# Patient Record
Sex: Female | Born: 1970 | Race: White | Hispanic: No | Marital: Married | Smoking: Never smoker
Health system: Southern US, Community
[De-identification: ages and names within clinical notes are randomized; demographics above are authoritative.]

## PROBLEM LIST (undated history)

## (undated) DIAGNOSIS — D649 Anemia, unspecified: Secondary | ICD-10-CM

## (undated) DIAGNOSIS — Z803 Family history of malignant neoplasm of breast: Secondary | ICD-10-CM

## (undated) HISTORY — DX: Anemia, unspecified: D64.9

## (undated) HISTORY — DX: Family history of malignant neoplasm of breast: Z80.3

---

## 2009-11-02 ENCOUNTER — Inpatient Hospital Stay: Payer: Self-pay

## 2009-11-05 LAB — PATHOLOGY REPORT

## 2013-05-06 ENCOUNTER — Emergency Department: Payer: Self-pay | Admitting: Emergency Medicine

## 2013-05-06 LAB — URINALYSIS, COMPLETE
Bilirubin,UR: NEGATIVE
GLUCOSE, UR: NEGATIVE mg/dL (ref 0–75)
Ketone: NEGATIVE
Nitrite: NEGATIVE
PROTEIN: NEGATIVE
Ph: 6 (ref 4.5–8.0)
RBC,UR: 5 /HPF (ref 0–5)
SPECIFIC GRAVITY: 1.008 (ref 1.003–1.030)
WBC UR: 114 /HPF (ref 0–5)

## 2013-11-23 ENCOUNTER — Ambulatory Visit: Payer: Self-pay | Admitting: Nurse Practitioner

## 2015-07-10 ENCOUNTER — Other Ambulatory Visit: Payer: Self-pay | Admitting: Obstetrics & Gynecology

## 2015-12-26 ENCOUNTER — Other Ambulatory Visit: Payer: Self-pay | Admitting: Obstetrics & Gynecology

## 2015-12-26 DIAGNOSIS — Z1231 Encounter for screening mammogram for malignant neoplasm of breast: Secondary | ICD-10-CM

## 2015-12-30 ENCOUNTER — Ambulatory Visit: Admission: RE | Admit: 2015-12-30 | Payer: Self-pay | Source: Ambulatory Visit

## 2016-04-01 DIAGNOSIS — R5383 Other fatigue: Secondary | ICD-10-CM | POA: Diagnosis not present

## 2016-04-01 DIAGNOSIS — R51 Headache: Secondary | ICD-10-CM | POA: Diagnosis not present

## 2016-04-23 DIAGNOSIS — R51 Headache: Secondary | ICD-10-CM | POA: Diagnosis not present

## 2016-04-29 DIAGNOSIS — G4452 New daily persistent headache (NDPH): Secondary | ICD-10-CM | POA: Diagnosis not present

## 2016-04-29 DIAGNOSIS — E559 Vitamin D deficiency, unspecified: Secondary | ICD-10-CM | POA: Diagnosis not present

## 2016-04-29 DIAGNOSIS — E538 Deficiency of other specified B group vitamins: Secondary | ICD-10-CM | POA: Diagnosis not present

## 2016-04-29 DIAGNOSIS — G933 Postviral fatigue syndrome: Secondary | ICD-10-CM | POA: Diagnosis not present

## 2016-07-06 DIAGNOSIS — G4452 New daily persistent headache (NDPH): Secondary | ICD-10-CM | POA: Diagnosis not present

## 2016-07-06 DIAGNOSIS — E559 Vitamin D deficiency, unspecified: Secondary | ICD-10-CM | POA: Diagnosis not present

## 2016-07-31 DIAGNOSIS — R05 Cough: Secondary | ICD-10-CM | POA: Diagnosis not present

## 2016-07-31 DIAGNOSIS — B9689 Other specified bacterial agents as the cause of diseases classified elsewhere: Secondary | ICD-10-CM | POA: Diagnosis not present

## 2016-07-31 DIAGNOSIS — J208 Acute bronchitis due to other specified organisms: Secondary | ICD-10-CM | POA: Diagnosis not present

## 2016-09-01 ENCOUNTER — Telehealth: Payer: Self-pay | Admitting: Obstetrics & Gynecology

## 2016-09-01 NOTE — Telephone Encounter (Signed)
Called and left voicemail for patient to call abck to be schedule for annual with Dr. Tiburcio PeaHarris. 08/04/2015 last annual appointment

## 2016-09-01 NOTE — Telephone Encounter (Signed)
-----   Message from Nadara Mustardobert P Harris, MD sent at 09/01/2016  9:32 AM EDT ----- Regarding: RE: Annual- PH Yes, sch (schedule) annual appt  ----- Message ----- From: Kathi LudwigPaschal, Sara E Sent: 09/01/2016   8:00 AM To: Nadara Mustardobert P Harris, MD Subject: RE: Annual- PH                                 Please advise Schedule appointment? Thank you. Huntley DecSara  ----- Message ----- From: Nadara MustardHarris, Robert P, MD Sent: 09/01/2016   7:50 AM To: Ws-Admin Subject: Annual- PH                                     Sch

## 2016-09-08 NOTE — Telephone Encounter (Signed)
[employer] calling to f/u on rx sent to office for pt's pain med request.  If approved please fax back to [phone] or call [phone]

## 2016-09-22 ENCOUNTER — Ambulatory Visit: Payer: Self-pay | Admitting: Obstetrics & Gynecology

## 2016-09-30 ENCOUNTER — Other Ambulatory Visit: Payer: Self-pay | Admitting: Obstetrics & Gynecology

## 2016-10-05 ENCOUNTER — Other Ambulatory Visit: Payer: Self-pay

## 2016-10-05 MED ORDER — LO LOESTRIN FE 1 MG-10 MCG / 10 MCG PO TABS: 1.0000 | ORAL_TABLET | Freq: Every day | ORAL | 0 refills | Status: DC

## 2016-11-05 ENCOUNTER — Ambulatory Visit: Payer: Self-pay | Admitting: Obstetrics & Gynecology

## 2016-12-02 ENCOUNTER — Encounter: Payer: Self-pay | Admitting: Obstetrics & Gynecology

## 2016-12-02 ENCOUNTER — Ambulatory Visit (INDEPENDENT_AMBULATORY_CARE_PROVIDER_SITE_OTHER): Payer: BLUE CROSS/BLUE SHIELD | Admitting: Obstetrics & Gynecology

## 2016-12-02 VITALS — BP 110/60 | HR 64 | Ht 67.0 in | Wt 125.0 lb

## 2016-12-02 DIAGNOSIS — Z01419 Encounter for gynecological examination (general) (routine) without abnormal findings: Secondary | ICD-10-CM | POA: Diagnosis not present

## 2016-12-02 DIAGNOSIS — Z0000 Encounter for general adult medical examination without abnormal findings: Secondary | ICD-10-CM

## 2016-12-02 DIAGNOSIS — Z1239 Encounter for other screening for malignant neoplasm of breast: Secondary | ICD-10-CM

## 2016-12-02 DIAGNOSIS — Z124 Encounter for screening for malignant neoplasm of cervix: Secondary | ICD-10-CM

## 2016-12-02 DIAGNOSIS — Z1231 Encounter for screening mammogram for malignant neoplasm of breast: Secondary | ICD-10-CM | POA: Diagnosis not present

## 2016-12-02 MED ORDER — LO LOESTRIN FE 1 MG-10 MCG / 10 MCG PO TABS: 1.0000 | ORAL_TABLET | Freq: Every day | ORAL | 3 refills | Status: DC

## 2016-12-02 NOTE — Progress Notes (Signed)
HPI:      [name] is a 46 y.o. [phone] who LMP was Patient's last menstrual period was 11/11/2016., she presents today for her annual examination. The patient has no complaints today. The patient is sexually active. Her last pap: was normal and last mammogram: approximate date 2017 and was normal. The patient does perform self breast exams.  There is no notable family history of breast or ovarian cancer in her family.  The patient has regular exercise: yes.  The patient denies current symptoms of depression.    GYN History: Contraception: OCP (estrogen/progesterone)  PMHx: Past Medical History:  Diagnosis Date  . Anemia    Past Surgical History:  Procedure Laterality Date  . CESAREAN SECTION     Family History  Problem Relation Age of Onset  . Cancer Father   . Breast cancer Maternal Grandmother   . Stomach cancer Maternal Grandmother   . Leukemia Maternal Grandfather   . Heart attack Paternal Grandmother    Social History  Substance Use Topics  . Smoking status: Never Smoker  . Smokeless tobacco: Never Used  . Alcohol use No    Current Outpatient Prescriptions:  .  LO LOESTRIN FE 1 MG-10 MCG / 10 MCG tablet, Take 1 tablet by mouth daily., Disp: 3 Package, Rfl: 0 .  vitamin B-12 (CYANOCOBALAMIN) 1000 MCG tablet, Take by mouth., Disp: , Rfl:  Allergies: Lactase  Review of Systems  Constitutional: Negative for chills, fever and malaise/fatigue.  HENT: Negative for congestion, sinus pain and sore throat.   Eyes: Negative for blurred vision and pain.  Respiratory: Negative for cough and wheezing.   Cardiovascular: Negative for chest pain and leg swelling.  Gastrointestinal: Negative for abdominal pain, constipation, diarrhea, heartburn, nausea and vomiting.  Genitourinary: Negative for dysuria, frequency, hematuria and urgency.  Musculoskeletal: Negative for back pain, joint pain, myalgias and neck pain.  Skin: Negative for itching and rash.  Neurological:  Negative for dizziness, tremors and weakness.  Endo/Heme/Allergies: Does not bruise/bleed easily.  Psychiatric/Behavioral: Negative for depression. The patient is not nervous/anxious and does not have insomnia.     Objective: BP 110/60   Pulse 64   Ht 5\' 7"  (1.702 m)   Wt 125 lb (56.7 kg)   LMP 11/11/2016   BMI 19.58 kg/m   Filed Weights   12/02/16 1515  Weight: 125 lb (56.7 kg)   Body mass index is 19.58 kg/m. Physical Exam  Constitutional: She is oriented to person, place, and time. She appears well-developed and well-nourished. No distress.  Genitourinary: Rectum normal, vagina normal and uterus normal. Pelvic exam was performed with patient supine. There is no rash or lesion on the right labia. There is no rash or lesion on the left labia. Vagina exhibits no lesion. No bleeding in the vagina. Right adnexum does not display mass and does not display tenderness. Left adnexum does not display mass and does not display tenderness. Cervix does not exhibit motion tenderness, lesion, friability or polyp.   Uterus is mobile and midaxial. Uterus is not enlarged or exhibiting a mass.  HENT:  Head: Normocephalic and atraumatic. Head is without laceration.  Right Ear: Hearing normal.  Left Ear: Hearing normal.  Nose: No epistaxis.  No foreign bodies.  Mouth/Throat: Uvula is midline, oropharynx is clear and moist and mucous membranes are normal.  Eyes: Pupils are equal, round, and reactive to light.  Neck: Normal range of motion. Neck supple. No thyromegaly present.  Cardiovascular: Normal rate and regular rhythm.  Exam reveals no gallop and no friction rub.   No murmur heard. Pulmonary/Chest: Effort normal and breath sounds normal. No respiratory distress. She has no wheezes. Right breast exhibits no mass, no skin change and no tenderness. Left breast exhibits no mass, no skin change and no tenderness.  Abdominal: Soft. Bowel sounds are normal. She exhibits no distension. There is no  tenderness. There is no rebound.  Musculoskeletal: Normal range of motion.  Neurological: She is alert and oriented to person, place, and time. No cranial nerve deficit.  Skin: Skin is warm and dry.  Psychiatric: She has a normal mood and affect. Judgment normal.  Vitals reviewed.  Assessment:  ANNUAL EXAM 1. Annual physical exam   2. Screening for cervical cancer   3. Screening for breast cancer    Screening Plan:            1.  Cervical Screening-  Pap smear done today  2. Breast screening- Exam annually and mammogram>40 planned   3. Colonoscopy every 10 years, Hemoccult testing - after age 47  4. Labs normal last year, repeat 2019  5. Counseling for contraception: oral contraceptives (estrogen/progesterone), at least until age 47; no change desired  6. PCP in area discussed     F/U  Return in about 1 year (around 12/02/2017) for Annual.  Annamarie MajorPaul Mishell Donalson, MD, Merlinda FrederickFACOG [facility], [facility] 12/02/2016  3:20 PM

## 2016-12-02 NOTE — Patient Instructions (Addendum)
PAP every three years Mammogram every year    Call [phone] [facility] Colonoscopy every 10 years after age 47 Labs next year   PRIMARY CARE Dr Marikay AlarEric [facility] physicians [facility]

## 2016-12-06 LAB — IGP, APTIMA HPV
HPV Aptima: NEGATIVE
PAP SMEAR COMMENT: 0

## 2016-12-26 ENCOUNTER — Other Ambulatory Visit: Payer: Self-pay | Admitting: Obstetrics & Gynecology

## 2017-03-04 DIAGNOSIS — H1132 Conjunctival hemorrhage, left eye: Secondary | ICD-10-CM | POA: Diagnosis not present

## 2017-04-11 ENCOUNTER — Telehealth: Payer: Self-pay

## 2017-04-11 ENCOUNTER — Other Ambulatory Visit: Payer: Self-pay | Admitting: Obstetrics & Gynecology

## 2017-04-11 MED ORDER — NORETHIN ACE-ETH ESTRAD-FE 1-20 MG-MCG PO TABS: 1.0000 | ORAL_TABLET | Freq: Every day | ORAL | 11 refills | Status: DC

## 2017-04-11 NOTE — Telephone Encounter (Signed)
lmtrc

## 2017-04-11 NOTE — Telephone Encounter (Signed)
Plan does not cover LoLoestrin can you prescribe something different.

## 2017-04-11 NOTE — Telephone Encounter (Signed)
ERx done LoLoEstrin/Junel generic

## 2017-09-01 ENCOUNTER — Telehealth: Payer: Self-pay

## 2017-09-01 NOTE — Telephone Encounter (Signed)
Pt needs [place] rx ref[place] to [place]. Cb#[phone]

## 2017-09-01 NOTE — Telephone Encounter (Signed)
Spoke w/pt. She is out of town & needs her OCP refill. [facility] [place]. Unable to pull pharmacy in Epic. Pt stated pharmacist there told her they didn't accept rx's from [facility]. It would need to come from [facility] [place] [facility]. Pt advised to contact her local [facility] where rx is currently & see if they can advise her if a possible rx transfer is possible between the pharmacies. She will return call if she needs further assistance.

## 2018-01-23 ENCOUNTER — Encounter: Payer: Self-pay | Admitting: Obstetrics & Gynecology

## 2018-01-23 ENCOUNTER — Ambulatory Visit (INDEPENDENT_AMBULATORY_CARE_PROVIDER_SITE_OTHER): Payer: BLUE CROSS/BLUE SHIELD | Admitting: Obstetrics & Gynecology

## 2018-01-23 VITALS — BP 120/80 | Ht 67.0 in | Wt 128.0 lb

## 2018-01-23 DIAGNOSIS — Z01419 Encounter for gynecological examination (general) (routine) without abnormal findings: Secondary | ICD-10-CM

## 2018-01-23 DIAGNOSIS — Z1239 Encounter for other screening for malignant neoplasm of breast: Secondary | ICD-10-CM

## 2018-01-23 DIAGNOSIS — Z0000 Encounter for general adult medical examination without abnormal findings: Secondary | ICD-10-CM

## 2018-01-23 MED ORDER — NORETHIN ACE-ETH ESTRAD-FE 1-20 MG-MCG PO TABS: 1.0000 | ORAL_TABLET | Freq: Every day | ORAL | 3 refills | Status: DC

## 2018-01-23 NOTE — Progress Notes (Signed)
HPI:      Rachel Tapia is a 47 y.o. [phone] who LMP was Patient's last menstrual period was 01/16/2018., she presents today for her annual examination. The patient has no complaints today. The patient is sexually active. Her last pap: approximate date 2018 and was normal and last mammogram: approximate date 2016 and was normal. The patient does perform self breast exams.  There is no notable family history of breast or ovarian cancer in her family.  The patient has regular exercise: yes.  The patient denies current symptoms of depression.  Reg periods.  Recent stress from probability of a big move and this has affected libido as well as periods somewhat  GYN History: Contraception: OCP (estrogen/progesterone)  PMHx: Past Medical History:  Diagnosis Date  . Anemia    Past Surgical History:  Procedure Laterality Date  . CESAREAN SECTION     Family History  Problem Relation Age of Onset  . Cancer Father   . Breast cancer Maternal Grandmother   . Stomach cancer Maternal Grandmother   . Leukemia Maternal Grandfather   . Heart attack Paternal Grandmother    Social History   Tobacco Use  . Smoking status: Never Smoker  . Smokeless tobacco: Never Used  Substance Use Topics  . Alcohol use: No  . Drug use: No    Current Outpatient Medications:  .  norethindrone-ethinyl estradiol (JUNEL FE,GILDESS FE,LOESTRIN FE) 1-20 MG-MCG tablet, Take 1 tablet by mouth daily., Disp: 1 Package, Rfl: 11 .  vitamin B-12 (CYANOCOBALAMIN) 1000 MCG tablet, Take by mouth., Disp: , Rfl:  Allergies: Lactase  Review of Systems  Constitutional: Negative for chills, fever and malaise/fatigue.  HENT: Negative for congestion, sinus pain and sore throat.   Eyes: Negative for blurred vision and pain.  Respiratory: Negative for cough and wheezing.   Cardiovascular: Negative for chest pain and leg swelling.  Gastrointestinal: Negative for abdominal pain, constipation, diarrhea, heartburn, nausea and  vomiting.  Genitourinary: Negative for dysuria, frequency, hematuria and urgency.  Musculoskeletal: Negative for back pain, joint pain, myalgias and neck pain.  Skin: Negative for itching and rash.  Neurological: Negative for dizziness, tremors and weakness.  Endo/Heme/Allergies: Does not bruise/bleed easily.  Psychiatric/Behavioral: Negative for depression. The patient is not nervous/anxious and does not have insomnia.     Objective: BP 120/80   Ht 5\' 7"  (1.702 m)   Wt 128 lb (58.1 kg)   LMP 01/16/2018   BMI 20.05 kg/m   Filed Weights   01/23/18 1608  Weight: 128 lb (58.1 kg)   Body mass index is 20.05 kg/m. Physical Exam Constitutional:      General: She is not in acute distress.    Appearance: She is well-developed.  Genitourinary:     Pelvic exam was performed with patient supine.     Vagina, uterus and rectum normal.     No lesions in the vagina.     No vaginal bleeding.     No cervical motion tenderness, friability, lesion or polyp.     Uterus is mobile.     Uterus is not enlarged.     No uterine mass detected.    Uterus is midaxial.     No right or left adnexal mass present.     Right adnexa not tender.     Left adnexa not tender.  HENT:     Head: Normocephalic and atraumatic. No laceration.     Right Ear: Hearing normal.     Left Ear: Hearing normal.  Mouth/Throat:     Pharynx: Uvula midline.  Eyes:     Pupils: Pupils are equal, round, and reactive to light.  Neck:     Musculoskeletal: Normal range of motion and neck supple.     Thyroid: No thyromegaly.  Cardiovascular:     Rate and Rhythm: Normal rate and regular rhythm.     Heart sounds: No murmur. No friction rub. No gallop.   Pulmonary:     Effort: Pulmonary effort is normal. No respiratory distress.     Breath sounds: Normal breath sounds. No wheezing.  Chest:     Breasts:        Right: No mass, skin change or tenderness.        Left: No mass, skin change or tenderness.  Abdominal:      General: Bowel sounds are normal. There is no distension.     Palpations: Abdomen is soft.     Tenderness: There is no abdominal tenderness. There is no rebound.  Musculoskeletal: Normal range of motion.  Neurological:     Mental Status: She is alert and oriented to person, place, and time.     Cranial Nerves: No cranial nerve deficit.  Skin:    General: Skin is warm and dry.  Psychiatric:        Judgment: Judgment normal.  Vitals signs reviewed.     Assessment:  ANNUAL EXAM 1. Annual physical exam   2. Screening for breast cancer    Screening Plan:            1.  Cervical Screening-  Pap smear schedule reviewed with patient  2. Breast screening- Exam annually and mammogram>40 planned and ENCOURAGED to have done this year  3. Colonoscopy every 10 years, Hemoccult testing - after age 28  4. Labs managed by PCP  5. Counseling for contraception: oral contraceptives (estrogen/progesterone), wishes to continue     F/U  Return in about 1 year (around 01/24/2019) for Annual.  Annamarie Major, MD, Merlinda Frederick Ob/Gyn, [facility] 01/23/2018  4:41 PM

## 2018-01-23 NOTE — Patient Instructions (Signed)
PAP every three years Mammogram every year    Call [phone] to schedule at [facility]  

## 2018-01-25 ENCOUNTER — Ambulatory Visit: Payer: Self-pay | Admitting: Obstetrics & Gynecology

## 2018-02-07 ENCOUNTER — Ambulatory Visit
Admission: RE | Admit: 2018-02-07 | Discharge: 2018-02-07 | Disposition: A | Discharge status: Home / Self Care | Payer: BLUE CROSS/BLUE SHIELD | Source: Ambulatory Visit | Attending: Obstetrics & Gynecology | Admitting: Obstetrics & Gynecology

## 2018-02-07 DIAGNOSIS — Z1239 Encounter for other screening for malignant neoplasm of breast: Secondary | ICD-10-CM | POA: Diagnosis not present

## 2018-02-07 DIAGNOSIS — Z1231 Encounter for screening mammogram for malignant neoplasm of breast: Secondary | ICD-10-CM | POA: Diagnosis not present

## 2018-02-09 ENCOUNTER — Telehealth: Payer: Self-pay

## 2018-02-09 NOTE — Telephone Encounter (Signed)
Left message to remind her to schedule her mammo

## 2018-02-09 NOTE — Telephone Encounter (Signed)
-----   Message from Nadara Mustard, MD sent at 02/07/2018  9:00 AM EST ----- Regarding: MMG Received notice she has not received MMG yet as ordered at her Annual. Please check and encourage her to do this, and document conversation.

## 2018-03-18 ENCOUNTER — Other Ambulatory Visit: Payer: Self-pay | Admitting: Obstetrics & Gynecology

## 2018-08-15 ENCOUNTER — Telehealth: Payer: Self-pay

## 2018-08-15 ENCOUNTER — Other Ambulatory Visit: Payer: Self-pay | Admitting: Obstetrics & Gynecology

## 2018-08-15 DIAGNOSIS — R5382 Chronic fatigue, unspecified: Secondary | ICD-10-CM

## 2018-08-15 NOTE — Telephone Encounter (Signed)
Pt calling; is concerned about her energy level; wondering if iron is low or if having blood sugar issues; knows she is hypoglycemic; wonders if something has changed; feels like she needs to have her blood checked for fatigue issues.  [phone]

## 2018-08-15 NOTE — Telephone Encounter (Signed)
Patient is schedule for 08/16/18

## 2018-08-15 NOTE — Telephone Encounter (Signed)
Orders placed for labs, schedule time to come and have blood drawn

## 2018-08-16 ENCOUNTER — Other Ambulatory Visit: Payer: 59

## 2018-08-16 ENCOUNTER — Other Ambulatory Visit: Payer: Self-pay

## 2018-08-16 DIAGNOSIS — R5382 Chronic fatigue, unspecified: Secondary | ICD-10-CM

## 2018-08-17 LAB — CBC WITH DIFFERENTIAL
Basophils Absolute: 0 10*3/uL (ref 0.0–0.2)
Basos: 1 %
EOS (ABSOLUTE): 0.1 10*3/uL (ref 0.0–0.4)
Eos: 1 %
Hematocrit: 38.6 % (ref 34.0–46.6)
Hemoglobin: 13 g/dL (ref 11.1–15.9)
Immature Grans (Abs): 0 10*3/uL (ref 0.0–0.1)
Immature Granulocytes: 0 %
Lymphocytes Absolute: 1.9 10*3/uL (ref 0.7–3.1)
Lymphs: 30 %
MCH: 29.3 pg (ref 26.6–33.0)
MCHC: 33.7 g/dL (ref 31.5–35.7)
MCV: 87 fL (ref 79–97)
Monocytes Absolute: 0.3 10*3/uL (ref 0.1–0.9)
Monocytes: 5 %
Neutrophils Absolute: 4.1 10*3/uL (ref 1.4–7.0)
Neutrophils: 63 %
RBC: 4.43 x10E6/uL (ref 3.77–5.28)
RDW: 12.7 % (ref 11.7–15.4)
WBC: 6.4 10*3/uL (ref 3.4–10.8)

## 2018-08-17 LAB — GLUCOSE, FASTING: Glucose, Plasma: 107 mg/dL — ABNORMAL HIGH (ref 65–99)

## 2018-08-17 LAB — TSH: TSH: 2.73 u[IU]/mL (ref 0.450–4.500)

## 2018-08-17 LAB — VITAMIN B12: Vitamin B-12: 345 pg/mL (ref 232–1245)

## 2018-08-17 NOTE — Progress Notes (Signed)
Pt wants to know was she supposed to fasting? She had ate a bagel before she had the blood work done, Pt is very upset about this level,

## 2018-08-17 NOTE — Progress Notes (Signed)
Let her know, all labs normal with borderline blood sugar level.  The diabetes testing reveals borderline elevation in blood sugar, or pre-diabetes.  This should be addressed by lifestyle modifications that include a diet lower in carbohydrates/ sugars, weight control/ loss, and regular fast-paced exercise.  Recommend repeat testing next year.

## 2018-08-17 NOTE — Progress Notes (Signed)
Yes, that is supposed to be a fasting test.  So she is probably just fine.  Can repeat if desired when fasting

## 2018-08-28 ENCOUNTER — Other Ambulatory Visit: Payer: Self-pay

## 2018-08-28 MED ORDER — NORETHIN ACE-ETH ESTRAD-FE 1-20 MG-MCG PO TABS: 1.0000 | ORAL_TABLET | Freq: Every day | ORAL | 3 refills | Status: DC

## 2019-01-15 ENCOUNTER — Other Ambulatory Visit: Payer: Self-pay | Admitting: Obstetrics & Gynecology

## 2019-01-15 DIAGNOSIS — Z1231 Encounter for screening mammogram for malignant neoplasm of breast: Secondary | ICD-10-CM

## 2019-01-23 ENCOUNTER — Telehealth: Payer: Self-pay

## 2019-01-23 ENCOUNTER — Ambulatory Visit
Admission: RE | Admit: 2019-01-23 | Discharge: 2019-01-23 | Disposition: A | Discharge status: Home / Self Care | Payer: 59 | Source: Ambulatory Visit | Attending: Obstetrics & Gynecology | Admitting: Obstetrics & Gynecology

## 2019-01-23 ENCOUNTER — Encounter: Payer: Self-pay | Admitting: Obstetrics & Gynecology

## 2019-01-23 DIAGNOSIS — Z1231 Encounter for screening mammogram for malignant neoplasm of breast: Secondary | ICD-10-CM | POA: Insufficient documentation

## 2019-01-23 NOTE — Telephone Encounter (Signed)
Pt calling; is moving; needs rx refill (call ends)  [phone]  [facility]

## 2019-01-30 ENCOUNTER — Ambulatory Visit: Payer: 59 | Admitting: Obstetrics & Gynecology

## 2019-01-30 MED ORDER — NORETHIN ACE-ETH ESTRAD-FE 1-20 MG-MCG PO TABS: 1.0000 | ORAL_TABLET | Freq: Every day | ORAL | 0 refills | Status: DC

## 2019-06-19 ENCOUNTER — Ambulatory Visit (INDEPENDENT_AMBULATORY_CARE_PROVIDER_SITE_OTHER): Payer: 59 | Admitting: Obstetrics & Gynecology

## 2019-06-19 ENCOUNTER — Other Ambulatory Visit: Payer: Self-pay

## 2019-06-19 ENCOUNTER — Other Ambulatory Visit (HOSPITAL_COMMUNITY)
Admission: RE | Admit: 2019-06-19 | Discharge: 2019-06-19 | Disposition: A | Discharge status: Home / Self Care | Payer: 59 | Source: Ambulatory Visit | Attending: Obstetrics & Gynecology | Admitting: Obstetrics & Gynecology

## 2019-06-19 ENCOUNTER — Encounter: Payer: Self-pay | Admitting: Obstetrics & Gynecology

## 2019-06-19 VITALS — BP 120/80 | Ht 67.0 in | Wt 127.0 lb

## 2019-06-19 DIAGNOSIS — Z124 Encounter for screening for malignant neoplasm of cervix: Secondary | ICD-10-CM | POA: Insufficient documentation

## 2019-06-19 DIAGNOSIS — Z01419 Encounter for gynecological examination (general) (routine) without abnormal findings: Secondary | ICD-10-CM | POA: Diagnosis not present

## 2019-06-19 DIAGNOSIS — Z1231 Encounter for screening mammogram for malignant neoplasm of breast: Secondary | ICD-10-CM

## 2019-06-19 MED ORDER — NORETHINDRONE ACET-ETHINYL EST 1-20 MG-MCG PO TABS: 1.0000 | ORAL_TABLET | Freq: Every day | ORAL | 3 refills | Status: DC

## 2019-06-19 NOTE — Progress Notes (Signed)
HPI:      [name] is a 49 y.o. [phone] who LMP was Patient's last menstrual period was 06/04/2019., she presents today for her annual examination. The patient has no complaints today. The patient is sexually active. Her last pap: approximate date 2018 and was normal and last mammogram: approximate date 2020 and was normal. The patient does perform self breast exams.  There is no notable family history of breast or ovarian cancer in her family.  The patient has regular exercise: yes.  The patient denies current symptoms of depression.    GYN History: Contraception: OCP (estrogen/progesterone)  PMHx: Past Medical History:  Diagnosis Date  . Anemia    Past Surgical History:  Procedure Laterality Date  . CESAREAN SECTION     Family History  Problem Relation Age of Onset  . Cancer Father   . Breast cancer Maternal Grandmother 60  . Stomach cancer Maternal Grandmother   . Leukemia Maternal Grandfather   . Heart attack Paternal Grandmother    Social History   Tobacco Use  . Smoking status: Never Smoker  . Smokeless tobacco: Never Used  Substance Use Topics  . Alcohol use: No  . Drug use: No    Current Outpatient Medications:  .  vitamin B-12 (CYANOCOBALAMIN) 1000 MCG tablet, Take by mouth., Disp: , Rfl:  .  norethindrone-ethinyl estradiol (JUNEL 1/20) 1-20 MG-MCG tablet, Take 1 tablet by mouth daily., Disp: 3 Package, Rfl: 3 Allergies: Lactase  Review of Systems  Constitutional: Negative for chills, fever and malaise/fatigue.  HENT: Negative for congestion, sinus pain and sore throat.   Eyes: Negative for blurred vision and pain.  Respiratory: Negative for cough and wheezing.   Cardiovascular: Negative for chest pain and leg swelling.  Gastrointestinal: Negative for abdominal pain, constipation, diarrhea, heartburn, nausea and vomiting.  Genitourinary: Negative for dysuria, frequency, hematuria and urgency.  Musculoskeletal: Negative for back pain, joint pain,  myalgias and neck pain.  Skin: Negative for itching and rash.  Neurological: Negative for dizziness, tremors and weakness.  Endo/Heme/Allergies: Does not bruise/bleed easily.  Psychiatric/Behavioral: Negative for depression. The patient is not nervous/anxious and does not have insomnia.     Objective: BP 120/80   Ht 5\' 7"  (1.702 m)   Wt 127 lb (57.6 kg)   LMP 06/04/2019   BMI 19.89 kg/m   Filed Weights   06/19/19 1341  Weight: 127 lb (57.6 kg)   Body mass index is 19.89 kg/m. Physical Exam Constitutional:      General: She is not in acute distress.    Appearance: She is well-developed.  Genitourinary:     Pelvic exam was performed with patient supine.     Vagina, uterus and rectum normal.     No lesions in the vagina.     No vaginal bleeding.     No cervical motion tenderness, friability, lesion or polyp.     Uterus is mobile.     Uterus is not enlarged.     No uterine mass detected.    Uterus is midaxial.     No right or left adnexal mass present.     Right adnexa not tender.     Left adnexa not tender.  HENT:     Head: Normocephalic and atraumatic. No laceration.     Right Ear: Hearing normal.     Left Ear: Hearing normal.     Mouth/Throat:     Pharynx: Uvula midline.  Eyes:     Pupils: Pupils are equal,  round, and reactive to light.  Neck:     Thyroid: No thyromegaly.  Cardiovascular:     Rate and Rhythm: Normal rate and regular rhythm.     Heart sounds: No murmur. No friction rub. No gallop.   Pulmonary:     Effort: Pulmonary effort is normal. No respiratory distress.     Breath sounds: Normal breath sounds. No wheezing.  Chest:     Breasts:        Right: No mass, skin change or tenderness.        Left: No mass, skin change or tenderness.  Abdominal:     General: Bowel sounds are normal. There is no distension.     Palpations: Abdomen is soft.     Tenderness: There is no abdominal tenderness. There is no rebound.  Musculoskeletal:        General:  Normal range of motion.     Cervical back: Normal range of motion and neck supple.  Neurological:     Mental Status: She is alert and oriented to person, place, and time.     Cranial Nerves: No cranial nerve deficit.  Skin:    General: Skin is warm and dry.  Psychiatric:        Judgment: Judgment normal.  Vitals reviewed.     Assessment:  ANNUAL EXAM 1. Women's annual routine gynecological examination   2. Encounter for screening mammogram for malignant neoplasm of breast   3. Screening for cervical cancer      Screening Plan:            1.  Cervical Screening-  Pap smear done today  2. Breast screening- Exam annually and mammogram>40 planned   3. Colonoscopy every 10 years, Hemoccult testing - after age 41  4. Labs managed by PCP  5. Counseling for contraception: oral contraceptives (estrogen/progesterone)    F/U  Return in about 1 year (around 06/18/2020) for Annual.  Annamarie Major, MD, Merlinda Frederick Ob/Gyn, [facility] 06/19/2019  2:36 PM

## 2019-06-19 NOTE — Patient Instructions (Signed)
PAP every three years (done today) Mammogram every year Colonoscopy every 10 years starting age 50 Labs yearly (with PCP)

## 2019-06-19 NOTE — Addendum Note (Signed)
Addended by: Nadara Mustard on: 06/19/2019 03:51 PM   Modules accepted: Orders

## 2019-06-21 ENCOUNTER — Encounter: Payer: Self-pay | Admitting: Obstetrics & Gynecology

## 2019-06-21 LAB — CYTOLOGY - PAP
Comment: NEGATIVE
Diagnosis: NEGATIVE
High risk HPV: NEGATIVE

## 2019-06-27 ENCOUNTER — Encounter: Payer: Self-pay | Admitting: Obstetrics and Gynecology

## 2019-09-05 ENCOUNTER — Telehealth: Payer: Self-pay

## 2019-09-05 NOTE — Telephone Encounter (Signed)
Pt left msg on triage asking RF be sent to [facility] off [address] in [place] tX. Called pt back to ask her if she was talking about her [facility]? She said yes. [facility] [place] [facility] sent to. She says that one is fine and she asked for full address of pharmacy. She also asked if it was ok to start today her [facility] since she was suppose to start last Sunday. Advised to wait to next Sunday. She denies being sex active. Shes currently looking for a new OB/GYN in [place].

## 2020-06-11 ENCOUNTER — Other Ambulatory Visit: Payer: Self-pay | Admitting: Obstetrics & Gynecology

## 2020-08-22 ENCOUNTER — Other Ambulatory Visit: Payer: Self-pay | Admitting: Obstetrics & Gynecology

## 2020-11-05 IMAGING — MG DIGITAL SCREENING BILAT W/ TOMO W/ CAD
8 series · 9 of 24 positions shown · non-contrast
Comparison: Previous exam(s).

CLINICAL DATA: Screening.

EXAM:
DIGITAL SCREENING BILATERAL MAMMOGRAM WITH TOMO AND CAD

[R MLO synth-2D]
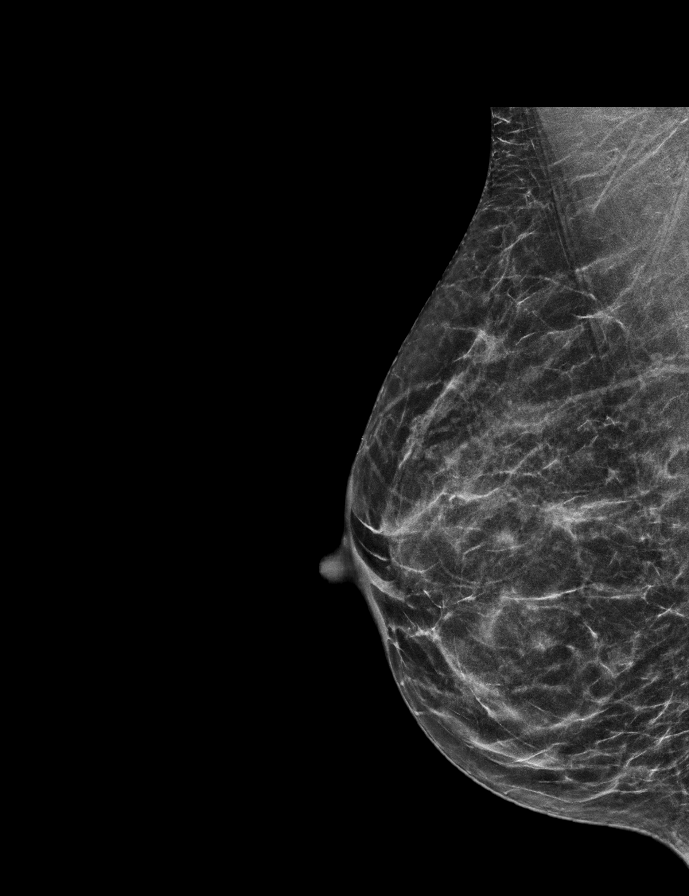

[L MLO synth-2D]
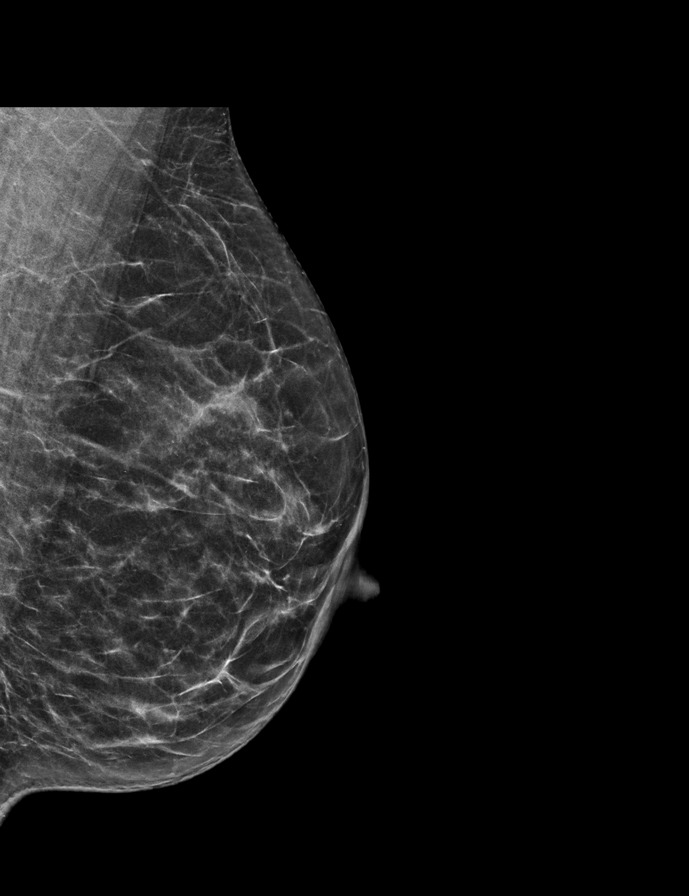

[R CC synth-2D]
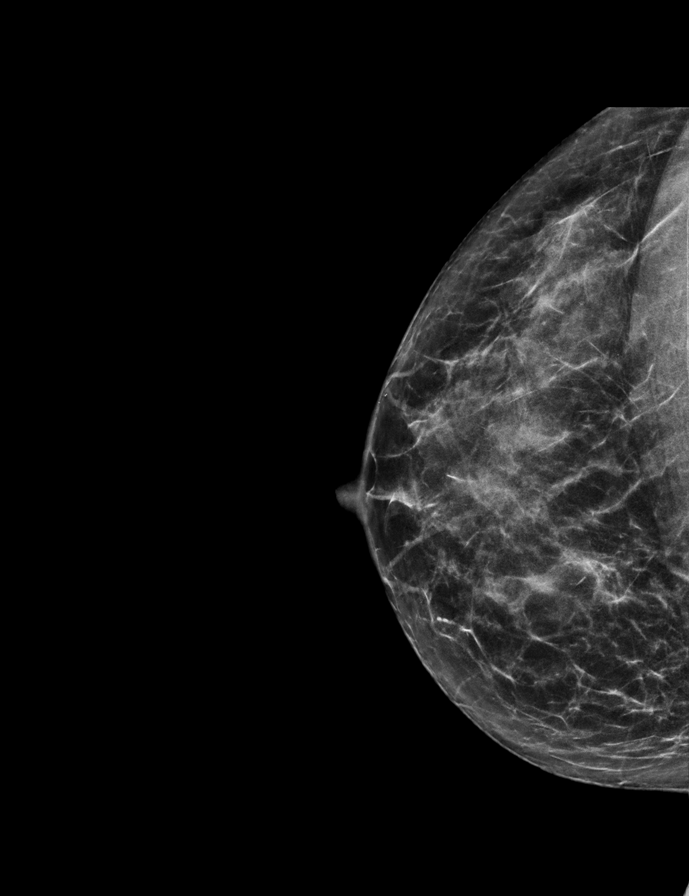

[L CC synth-2D]
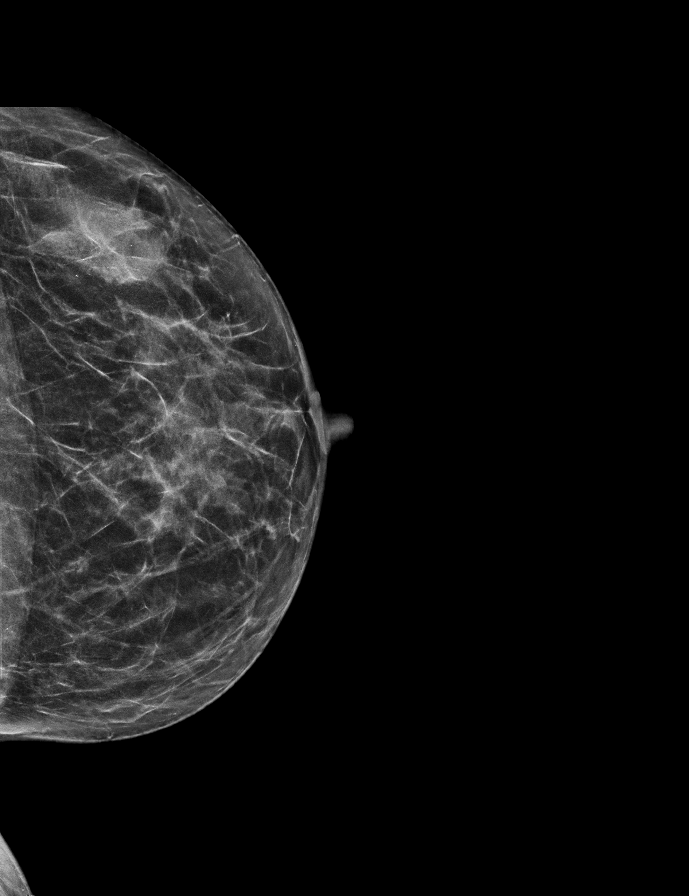

[R CC tomo · 2 of 55 frames shown]
[frame 18/55]
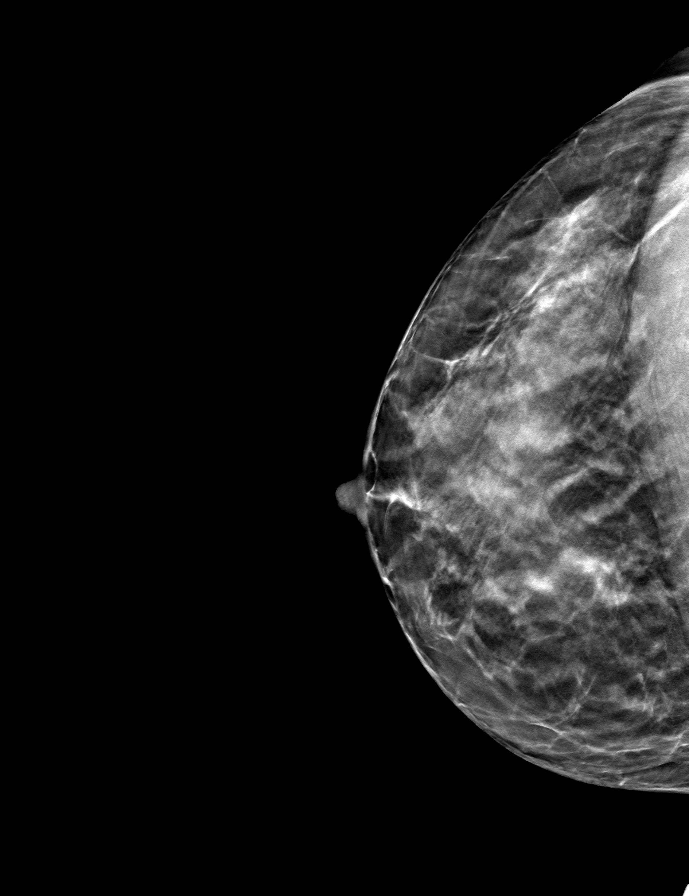
[frame 28/55]
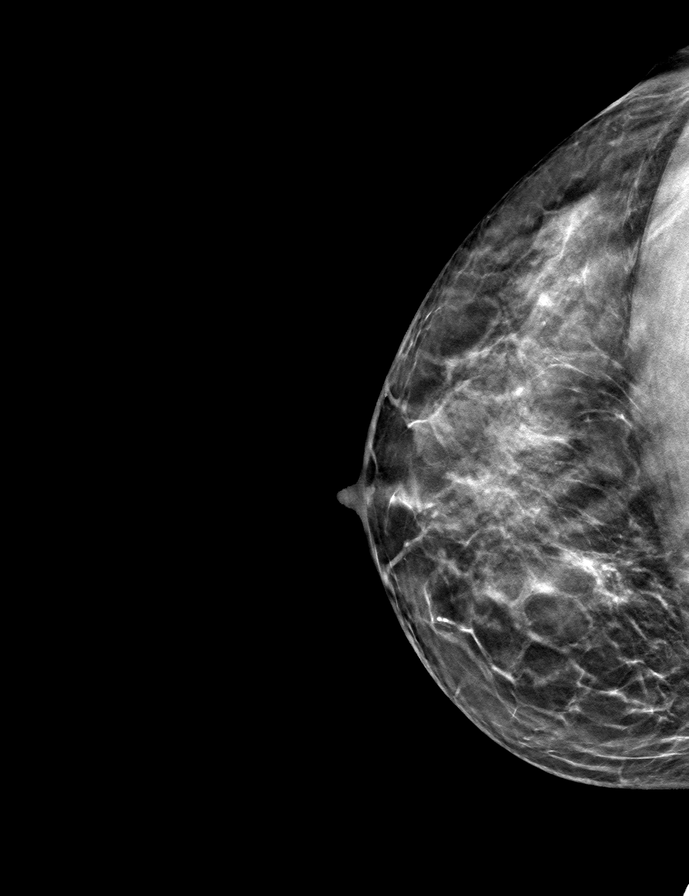

[R MLO tomo · tomo slice 25/48.0]
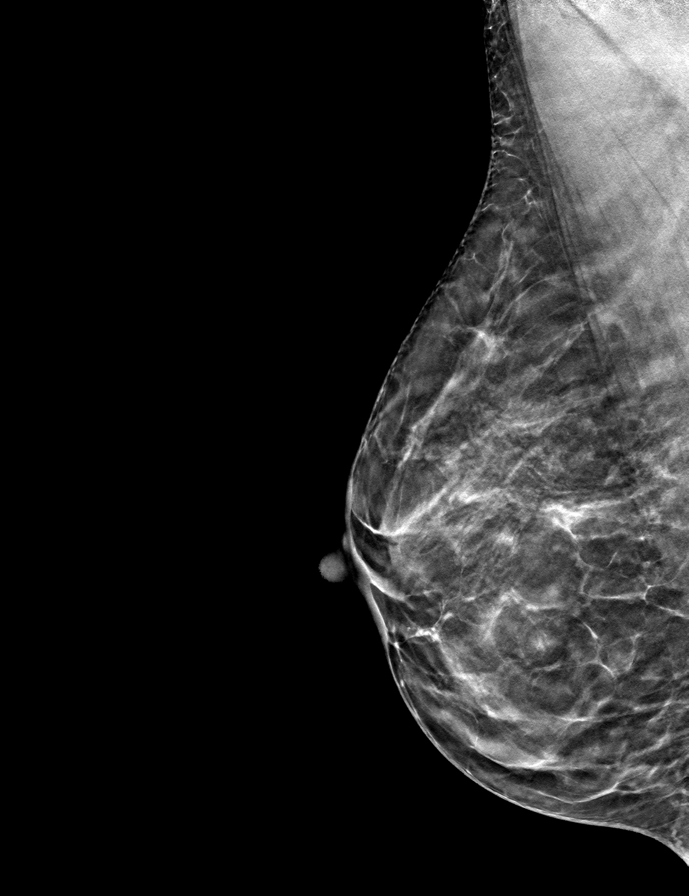

[L CC tomo · tomo slice 27/54.0]
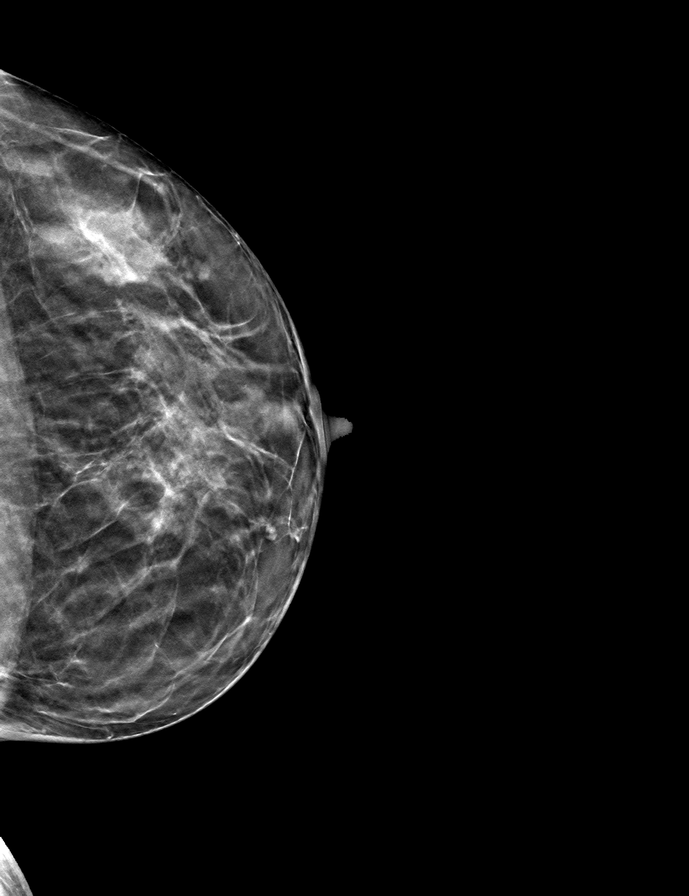

[L MLO tomo · tomo slice 27/52.0]
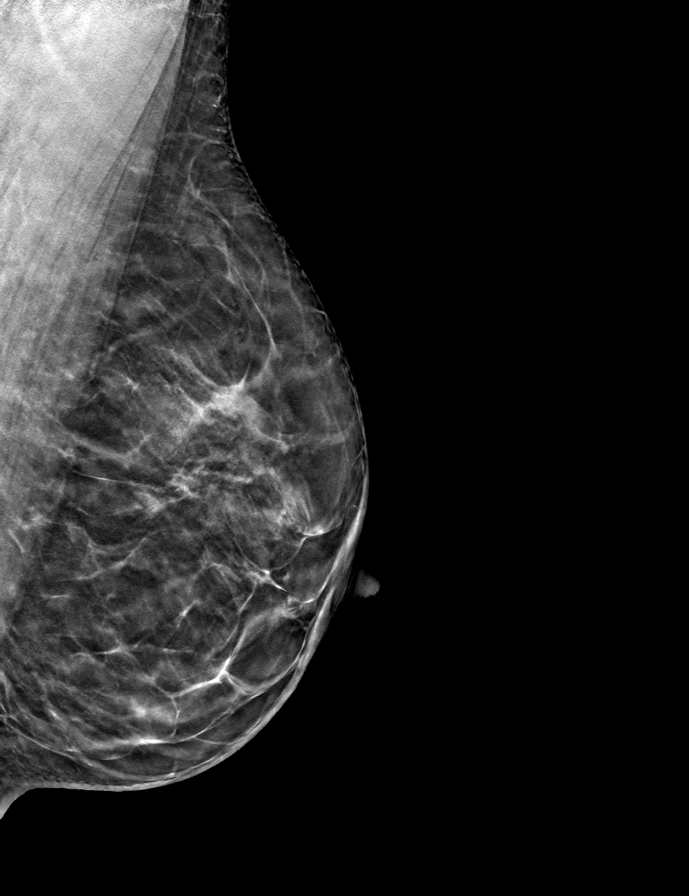

[9 of 24 positions shown; findings below may reference images not displayed]

ACR Breast Density Category c: The breast tissue is heterogeneously
dense, which may obscure small masses.
FINDINGS: There are no findings suspicious for malignancy. Images were
processed with CAD.
IMPRESSION: No mammographic evidence of malignancy. A result letter of this
screening mammogram will be mailed directly to the patient.

RECOMMENDATION:
Screening mammogram in one year. (Code:FT-U-LHB)

BI-RADS CATEGORY  1: Negative.
